# Patient Record
Sex: Female | Born: 1965 | Race: White | Marital: Married | Smoking: Never smoker
Health system: Southern US, Community
[De-identification: ages and names within clinical notes are randomized; demographics above are authoritative.]

## PROBLEM LIST (undated history)

## (undated) DIAGNOSIS — L900 Lichen sclerosus et atrophicus: Secondary | ICD-10-CM

## (undated) DIAGNOSIS — T7840XA Allergy, unspecified, initial encounter: Secondary | ICD-10-CM

## (undated) DIAGNOSIS — M51369 Other intervertebral disc degeneration, lumbar region without mention of lumbar back pain or lower extremity pain: Secondary | ICD-10-CM

## (undated) DIAGNOSIS — M5136 Other intervertebral disc degeneration, lumbar region: Secondary | ICD-10-CM

## (undated) DIAGNOSIS — N952 Postmenopausal atrophic vaginitis: Secondary | ICD-10-CM

## (undated) HISTORY — DX: Other intervertebral disc degeneration, lumbar region: M51.36

## (undated) HISTORY — DX: Lichen sclerosus et atrophicus: L90.0

## (undated) HISTORY — DX: Allergy, unspecified, initial encounter: T78.40XA

## (undated) HISTORY — DX: Postmenopausal atrophic vaginitis: N95.2

## (undated) HISTORY — DX: Other intervertebral disc degeneration, lumbar region without mention of lumbar back pain or lower extremity pain: M51.369

---

## 2003-01-06 HISTORY — PX: CHOLECYSTECTOMY: SHX55

## 2005-01-05 HISTORY — PX: DILATION AND CURETTAGE OF UTERUS: SHX78

## 2019-02-22 LAB — RESULTS CONSOLE HPV: CHL HPV: NEGATIVE

## 2019-02-22 LAB — HM PAP SMEAR

## 2020-03-05 DIAGNOSIS — Z1231 Encounter for screening mammogram for malignant neoplasm of breast: Secondary | ICD-10-CM | POA: Diagnosis not present

## 2020-03-05 DIAGNOSIS — Z13228 Encounter for screening for other metabolic disorders: Secondary | ICD-10-CM | POA: Diagnosis not present

## 2020-03-05 DIAGNOSIS — Z131 Encounter for screening for diabetes mellitus: Secondary | ICD-10-CM | POA: Diagnosis not present

## 2020-03-05 DIAGNOSIS — Z1321 Encounter for screening for nutritional disorder: Secondary | ICD-10-CM | POA: Diagnosis not present

## 2020-03-05 DIAGNOSIS — Z13220 Encounter for screening for lipoid disorders: Secondary | ICD-10-CM | POA: Diagnosis not present

## 2020-03-05 DIAGNOSIS — N939 Abnormal uterine and vaginal bleeding, unspecified: Secondary | ICD-10-CM | POA: Diagnosis not present

## 2020-03-05 DIAGNOSIS — Z1329 Encounter for screening for other suspected endocrine disorder: Secondary | ICD-10-CM | POA: Diagnosis not present

## 2020-03-05 DIAGNOSIS — Z130 Encounter for screening for diseases of the blood and blood-forming organs and certain disorders involving the immune mechanism: Secondary | ICD-10-CM | POA: Diagnosis not present

## 2020-03-05 DIAGNOSIS — Z01419 Encounter for gynecological examination (general) (routine) without abnormal findings: Secondary | ICD-10-CM | POA: Diagnosis not present

## 2020-03-18 DIAGNOSIS — Z1231 Encounter for screening mammogram for malignant neoplasm of breast: Secondary | ICD-10-CM | POA: Diagnosis not present

## 2020-03-20 DIAGNOSIS — N950 Postmenopausal bleeding: Secondary | ICD-10-CM | POA: Diagnosis not present

## 2020-03-20 DIAGNOSIS — Z01818 Encounter for other preprocedural examination: Secondary | ICD-10-CM | POA: Diagnosis not present

## 2020-03-20 DIAGNOSIS — N939 Abnormal uterine and vaginal bleeding, unspecified: Secondary | ICD-10-CM | POA: Diagnosis not present

## 2020-04-04 DIAGNOSIS — D219 Benign neoplasm of connective and other soft tissue, unspecified: Secondary | ICD-10-CM | POA: Diagnosis not present

## 2020-05-08 DIAGNOSIS — Z8739 Personal history of other diseases of the musculoskeletal system and connective tissue: Secondary | ICD-10-CM | POA: Insufficient documentation

## 2020-05-08 DIAGNOSIS — D259 Leiomyoma of uterus, unspecified: Secondary | ICD-10-CM | POA: Diagnosis not present

## 2020-05-08 DIAGNOSIS — Z0000 Encounter for general adult medical examination without abnormal findings: Secondary | ICD-10-CM | POA: Diagnosis not present

## 2020-05-15 ENCOUNTER — Other Ambulatory Visit: Payer: Self-pay | Admitting: Obstetrics and Gynecology

## 2020-05-15 DIAGNOSIS — D259 Leiomyoma of uterus, unspecified: Secondary | ICD-10-CM

## 2020-05-31 ENCOUNTER — Other Ambulatory Visit: Payer: Self-pay

## 2020-05-31 ENCOUNTER — Ambulatory Visit
Admission: RE | Admit: 2020-05-31 | Discharge: 2020-05-31 | Disposition: A | Discharge status: Home / Self Care | Payer: 59 | Source: Ambulatory Visit | Attending: Obstetrics and Gynecology | Admitting: Obstetrics and Gynecology

## 2020-05-31 DIAGNOSIS — D251 Intramural leiomyoma of uterus: Secondary | ICD-10-CM | POA: Diagnosis not present

## 2020-05-31 DIAGNOSIS — D259 Leiomyoma of uterus, unspecified: Secondary | ICD-10-CM

## 2020-05-31 MED ORDER — GADOBENATE DIMEGLUMINE 529 MG/ML IV SOLN
15.0000 mL | Freq: Once | INTRAVENOUS | Status: AC | PRN
  Administered 2020-05-31: 15 mL via INTRAVENOUS

## 2021-04-04 DIAGNOSIS — Z1231 Encounter for screening mammogram for malignant neoplasm of breast: Secondary | ICD-10-CM | POA: Diagnosis not present

## 2021-04-04 LAB — HM MAMMOGRAPHY

## 2021-04-17 DIAGNOSIS — Z01419 Encounter for gynecological examination (general) (routine) without abnormal findings: Secondary | ICD-10-CM | POA: Diagnosis not present

## 2021-04-17 DIAGNOSIS — Z6828 Body mass index (BMI) 28.0-28.9, adult: Secondary | ICD-10-CM | POA: Diagnosis not present

## 2021-06-03 DIAGNOSIS — D259 Leiomyoma of uterus, unspecified: Secondary | ICD-10-CM | POA: Diagnosis not present

## 2021-06-03 DIAGNOSIS — M79671 Pain in right foot: Secondary | ICD-10-CM | POA: Diagnosis not present

## 2021-06-03 DIAGNOSIS — Z0000 Encounter for general adult medical examination without abnormal findings: Secondary | ICD-10-CM | POA: Diagnosis not present

## 2021-06-03 DIAGNOSIS — R39198 Other difficulties with micturition: Secondary | ICD-10-CM | POA: Diagnosis not present

## 2021-06-06 DIAGNOSIS — D259 Leiomyoma of uterus, unspecified: Secondary | ICD-10-CM | POA: Insufficient documentation

## 2021-11-12 DIAGNOSIS — N952 Postmenopausal atrophic vaginitis: Secondary | ICD-10-CM | POA: Diagnosis not present

## 2021-11-12 DIAGNOSIS — L900 Lichen sclerosus et atrophicus: Secondary | ICD-10-CM | POA: Diagnosis not present

## 2022-02-12 DIAGNOSIS — R6882 Decreased libido: Secondary | ICD-10-CM | POA: Diagnosis not present

## 2022-02-12 DIAGNOSIS — N952 Postmenopausal atrophic vaginitis: Secondary | ICD-10-CM | POA: Diagnosis not present

## 2022-02-12 DIAGNOSIS — L900 Lichen sclerosus et atrophicus: Secondary | ICD-10-CM | POA: Diagnosis not present

## 2022-02-17 ENCOUNTER — Ambulatory Visit (INDEPENDENT_AMBULATORY_CARE_PROVIDER_SITE_OTHER): Payer: 59

## 2022-02-17 ENCOUNTER — Ambulatory Visit (INDEPENDENT_AMBULATORY_CARE_PROVIDER_SITE_OTHER): Payer: 59 | Admitting: Family Medicine

## 2022-02-17 ENCOUNTER — Encounter: Payer: Self-pay | Admitting: Family Medicine

## 2022-02-17 VITALS — BP 116/68 | HR 68 | Temp 98.1°F | Ht 65.0 in | Wt 171.9 lb

## 2022-02-17 DIAGNOSIS — G8929 Other chronic pain: Secondary | ICD-10-CM

## 2022-02-17 DIAGNOSIS — Z7689 Persons encountering health services in other specified circumstances: Secondary | ICD-10-CM | POA: Diagnosis not present

## 2022-02-17 DIAGNOSIS — M545 Low back pain, unspecified: Secondary | ICD-10-CM

## 2022-02-17 DIAGNOSIS — M533 Sacrococcygeal disorders, not elsewhere classified: Secondary | ICD-10-CM

## 2022-02-17 NOTE — Progress Notes (Signed)
New Patient Office Visit  Subjective    Patient ID: CHAVON DUSH, female    DOB: May 21, 1965  Age: 57 y.o. MRN: [id number]  CC:  Chief Complaint  Patient presents with   Establish Care   Generalized Body Aches    She is currently doing exercise to help relieve pain.    Sciatica    Throbbing in legs at night when trying to sleep. She has not taken anything for pain.     HPI AARICA GUSTAFSON presents to establish care. Pt is new to me. Works at [employer].  Pt has hx of Lichen Sclerosis, using Clobetasol. Diagnosed in November 2023. Seeing OB/GYN. UTD with pap smears.  She gets mammograms every year. She has family hx of mother, maternal aunts with breast cancer. Her mother and one aunt has passed from breast cancer. She has 1 aunt still living with hx of breast cancer. Mother diagnosed at 74 y.o.  Pt reports she is Postmenopausal. She is having sciatica pains in bilateral legs. She has been sedentary more lately. She reports weight is consistent. Pain starts in bilateral SI joints with some pain shooting down her lateral knees. Other times it's also goes down to right foot. She has not taken anything for the pain. She has issues with swallowing. Non-smoker. She has been doing PT at home and it helps some. She is doing bridges, modified plank, pelvic tilts. She has brief episodes of sensations of her leg giving out. This occurred after walking from the parking garage.   Outpatient Encounter Medications as of 02/17/2022  Medication Sig   clobetasol ointment (TEMOVATE) [id number] % Apply 1 Application topically 2 (two) times daily. vaginal   No facility-administered encounter medications on file as of 02/17/2022.    Past Medical History:  Diagnosis Date   Lichen sclerosus et atrophicus    Postmenopausal atrophic vaginitis    Uterine leiomyoma 06/06/2021    Past Surgical History:  Procedure Laterality Date   CHOLECYSTECTOMY  2005   DILATION AND CURETTAGE OF UTERUS  2007     Family History  Problem Relation Age of Onset   Cancer Mother    Cancer Father    Hypertension Father    Heart disease Father     Social History   Socioeconomic History   Marital status: Married    Spouse name: Not on file   Number of children: Not on file   Years of education: Not on file   Highest education level: Not on file  Occupational History   Occupation: [employer]  Tobacco Use   Smoking status: Never   Smokeless tobacco: Never  Substance and Sexual Activity   Alcohol use: Yes    Alcohol/week: 5.0 standard drinks of alcohol    Types: 4 Glasses of wine, 1 Cans of beer per week   Drug use: Never   Sexual activity: Yes  Other Topics Concern   Not on file  Social History Narrative   Not on file   Social Determinants of Health   Financial Resource Strain: Not on file  Food Insecurity: Not on file  Transportation Needs: Not on file  Physical Activity: Not on file  Stress: Not on file  Social Connections: Not on file  Intimate Partner Violence: Not on file    Review of Systems  Musculoskeletal:  Positive for back pain.       With sciatic leg pain  All other systems reviewed and are negative.  Objective    BP 116/68   Pulse 68   Temp 98.1 F (36.7 C) (Oral)   Ht 5' 5"$  (1.651 m)   Wt 171 lb 14.4 oz (78 kg)   LMP  (LMP Unknown)   SpO2 99%   BMI 28.61 kg/m   Physical Exam Vitals and nursing note reviewed.  Constitutional:      Appearance: Normal appearance. She is normal weight.  HENT:     Head: Normocephalic and atraumatic.     Right Ear: External ear normal.     Left Ear: External ear normal.     Nose: Nose normal.     Mouth/Throat:     Mouth: Mucous membranes are moist.     Pharynx: Oropharynx is clear.  Eyes:     Conjunctiva/sclera: Conjunctivae normal.     Pupils: Pupils are equal, round, and reactive to light.  Cardiovascular:     Rate and Rhythm: Normal rate and regular rhythm.     Pulses: Normal pulses.      Heart sounds: Normal heart sounds.  Pulmonary:     Effort: Pulmonary effort is normal.     Breath sounds: Normal breath sounds.  Abdominal:     General: Abdomen is flat.  Musculoskeletal:        General: Normal range of motion.  Skin:    General: Skin is warm.     Capillary Refill: Capillary refill takes less than 2 seconds.  Neurological:     General: No focal deficit present.     Mental Status: She is alert and oriented to person, place, and time. Mental status is at baseline.  Psychiatric:        Mood and Affect: Mood normal.        Behavior: Behavior normal.        Thought Content: Thought content normal.        Judgment: Judgment normal.        Assessment & Plan:   Problem List Items Addressed This Visit   None Visit Diagnoses     Encounter to establish care with new doctor    -  Primary   Chronic SI joint pain       Relevant Orders   DG Lumbar Spine Complete      Pt with recurrent SI joint pain with sciatica. Never had imaging done Send for imaging and sent home handout with PT exercises. Advised to do OTC ibuprofen, heating pads, aleve/voltaren gel, stretches.  Follow up pending results Annual exam May  Return in about 4 months (around 06/05/2022) for Annual Physical.   Leeanne Rio, MD

## 2022-03-13 ENCOUNTER — Other Ambulatory Visit (HOSPITAL_COMMUNITY): Payer: Self-pay

## 2022-03-13 ENCOUNTER — Telehealth: Payer: 59 | Admitting: Nurse Practitioner

## 2022-03-13 DIAGNOSIS — J069 Acute upper respiratory infection, unspecified: Secondary | ICD-10-CM

## 2022-03-13 MED ORDER — IPRATROPIUM BROMIDE 0.03 % NA SOLN
2.0000 | Freq: Two times a day (BID) | NASAL | 12 refills | Status: DC
  Filled 2022-03-13: qty 30, 43d supply, fill #0

## 2022-03-13 MED ORDER — BENZONATATE 100 MG PO CAPS
100.0000 mg | ORAL_CAPSULE | Freq: Three times a day (TID) | ORAL | 0 refills | Status: DC | PRN
  Filled 2022-03-13: qty 30, 10d supply, fill #0

## 2022-03-13 NOTE — Progress Notes (Signed)
E-Visit for Upper Respiratory Infection   We are sorry you are not feeling well.  Here is how we plan to help!  Based on what you have shared with me, it looks like you may have a viral upper respiratory infection.  Upper respiratory infections are caused by a large number of viruses; however, rhinovirus is the most common cause.   Symptoms vary from person to person, with common symptoms including sore throat, cough, fatigue or lack of energy and feeling of general discomfort.  A low-grade fever of up to 100.4 may present, but is often uncommon.  Symptoms vary however, and are closely related to a person's age or underlying illnesses.  The most common symptoms associated with an upper respiratory infection are nasal discharge or congestion, cough, sneezing, headache and pressure in the ears and face.  These symptoms usually persist for about 3 to 10 days, but can last up to 2 weeks.  It is important to know that upper respiratory infections do not cause serious illness or complications in most cases.    Providers prescribe antibiotics to treat infections caused by bacteria. Antibiotics are very powerful in treating bacterial infections when they are used properly. To maintain their effectiveness, they should be used only when necessary. Overuse of antibiotics has resulted in the development of superbugs that are resistant to treatment!    After careful review of your answers, I would not recommend an antibiotic for your condition.  Antibiotics are not effective against viruses and therefore should not be used to treat them. Common examples of infections caused by viruses include colds and flu   Upper respiratory infections can be transmitted from person to person, with the most common method of transmission being a person's hands.  The virus is able to live on the skin and can infect other persons for up to 2 hours after direct contact.  Also, these can be transmitted when someone coughs or sneezes;  thus, it is important to cover the mouth to reduce this risk.  To keep the spread of the illness at [place], good hand hygiene is very important.  This is an infection that is most likely caused by a virus. There are no specific treatments other than to help you with the symptoms until the infection runs its course.  We are sorry you are not feeling well.  Here is how we plan to help!   For nasal congestion, you may use an oral decongestants such as Mucinex D or if you have glaucoma or high blood pressure use plain Mucinex.  Saline nasal spray or nasal drops can help and can safely be used as often as needed for congestion.  For your congestion, I have prescribed Ipratropium Bromide nasal spray 0.03% two sprays in each nostril 2-3 times a day  If you do not have a history of heart disease, hypertension, diabetes or thyroid disease, prostate/bladder issues or glaucoma, you may also use Sudafed to treat nasal congestion.  It is highly recommended that you consult with a pharmacist or your primary care physician to ensure this medication is safe for you to take.     If you have a cough, you may use cough suppressants such as Delsym and Robitussin.  If you have glaucoma or high blood pressure, you can also use Coricidin HBP.   For cough I have prescribed for you A prescription cough medication called Tessalon Perles 100 mg. You may take 1-2 capsules every 8 hours as needed for cough  If  you have a sore or scratchy throat, use a saltwater gargle-  to  teaspoon of salt dissolved in a 4-ounce to 8-ounce glass of warm water.  Gargle the solution for approximately 15-30 seconds and then spit.  It is important not to swallow the solution.  You can also use throat lozenges/cough drops and Chloraseptic spray to help with throat pain or discomfort.  Warm or cold liquids can also be helpful in relieving throat pain.  For headache, pain or general discomfort, you can use Ibuprofen or Tylenol as directed.   Some  authorities believe that zinc sprays or the use of Echinacea may shorten the course of your symptoms.   HOME CARE Only take medications as instructed by your medical team. Be sure to drink plenty of fluids. Water is fine as well as fruit juices, sodas and electrolyte beverages. You may want to stay away from caffeine or alcohol. If you are nauseated, try taking small sips of liquids. How do you know if you are getting enough fluid? Your urine should be a pale yellow or almost colorless. Get rest. Taking a steamy shower or using a humidifier may help nasal congestion and ease sore throat pain. You can place a towel over your head and breathe in the steam from hot water coming from a faucet. Using a saline nasal spray works much the same way. Cough drops, hard candies and sore throat lozenges may ease your cough. Avoid close contacts especially the very young and the elderly Cover your mouth if you cough or sneeze Always remember to wash your hands.   GET HELP RIGHT AWAY IF: You develop worsening fever. If your symptoms do not improve within 10 days You develop yellow or green discharge from your nose over 3 days. You have coughing fits You develop a severe head ache or visual changes. You develop shortness of breath, difficulty breathing or start having chest pain Your symptoms persist after you have completed your treatment plan  MAKE SURE YOU  Understand these instructions. Will watch your condition. Will get help right away if you are not doing well or get worse.  Thank you for choosing an e-visit.  Your e-visit answers were reviewed by a board certified advanced clinical practitioner to complete your personal care plan. Depending upon the condition, your plan could have included both over the counter or prescription medications.  Please review your pharmacy choice. Make sure the pharmacy is open so you can pick up prescription now. If there is a problem, you may contact your  provider through [employer] and have the prescription routed to another pharmacy.  Your safety is important to [place]. If you have drug allergies check your prescription carefully.   For the next 24 hours you can use MyChart to ask questions about today's visit, request a non-urgent call back, or ask for a work or school excuse. You will get an email in the next two days asking about your experience. I hope that your e-visit has been valuable and will speed your recovery.  Meds ordered this encounter  Medications   ipratropium (ATROVENT) 0.03 % nasal spray    Sig: Place 2 sprays into both nostrils every 12 (twelve) hours.    Dispense:  30 mL    Refill:  12   benzonatate (TESSALON) 100 MG capsule    Sig: Take 1 capsule (100 mg total) by mouth 3 (three) times daily as needed.    Dispense:  30 capsule    Refill:  0  I spent approximately 5 minutes reviewing the patient's history, current symptoms and coordinating their care today.

## 2022-03-17 ENCOUNTER — Telehealth: Payer: 59 | Admitting: Nurse Practitioner

## 2022-03-17 ENCOUNTER — Other Ambulatory Visit (HOSPITAL_COMMUNITY): Payer: Self-pay

## 2022-03-17 DIAGNOSIS — J0140 Acute pansinusitis, unspecified: Secondary | ICD-10-CM

## 2022-03-17 MED ORDER — DOXYCYCLINE HYCLATE 100 MG PO TABS
100.0000 mg | ORAL_TABLET | Freq: Two times a day (BID) | ORAL | 0 refills | Status: AC
  Filled 2022-03-17: qty 20, 10d supply, fill #0

## 2022-03-17 NOTE — Progress Notes (Signed)
E-Visit for Sinus Problems  We are sorry that you are not feeling well.  Here is how we plan to help!  Based on what you have shared with me it looks like you have sinusitis.  Sinusitis is inflammation and infection in the sinus cavities of the head.  Based on your presentation I believe you most likely have Acute Bacterial Sinusitis.  This is an infection caused by bacteria and is treated with antibiotics. I have prescribed Doxycycline 100mg by mouth twice a day for 10 days. You may use an oral decongestant such as Mucinex D or if you have glaucoma or high blood pressure use plain Mucinex. Saline nasal spray help and can safely be used as often as needed for congestion.  If you develop worsening sinus pain, fever or notice severe headache and vision changes, or if symptoms are not better after completion of antibiotic, please schedule an appointment with a health care provider.    Sinus infections are not as easily transmitted as other respiratory infection, however we still recommend that you avoid close contact with loved ones, especially the very young and elderly.  Remember to wash your hands thoroughly throughout the day as this is the number one way to prevent the spread of infection!  Home Care: Only take medications as instructed by your medical team. Complete the entire course of an antibiotic. Do not take these medications with alcohol. A steam or ultrasonic humidifier can help congestion.  You can place a towel over your head and breathe in the steam from hot water coming from a faucet. Avoid close contacts especially the very young and the elderly. Cover your mouth when you cough or sneeze. Always remember to wash your hands.  Get Help Right Away If: You develop worsening fever or sinus pain. You develop a severe head ache or visual changes. Your symptoms persist after you have completed your treatment plan.  Make sure you Understand these instructions. Will watch your  condition. Will get help right away if you are not doing well or get worse.  Thank you for choosing an e-visit.  Your e-visit answers were reviewed by a board certified advanced clinical practitioner to complete your personal care plan. Depending upon the condition, your plan could have included both over the counter or prescription medications.  Please review your pharmacy choice. Make sure the pharmacy is open so you can pick up prescription now. If there is a problem, you may contact your provider through MyChart messaging and have the prescription routed to another pharmacy.  Your safety is important to us. If you have drug allergies check your prescription carefully.   For the next 24 hours you can use MyChart to ask questions about today's visit, request a non-urgent call back, or ask for a work or school excuse. You will get an email in the next two days asking about your experience. I hope that your e-visit has been valuable and will speed your recovery.   Meds ordered this encounter  Medications   doxycycline (VIBRA-TABS) 100 MG tablet    Sig: Take 1 tablet (100 mg total) by mouth 2 (two) times daily for 10 days.    Dispense:  20 tablet    Refill:  0    I spent approximately 5 minutes reviewing the patient's history, current symptoms and coordinating their care today.   

## 2022-04-06 DIAGNOSIS — R92323 Mammographic fibroglandular density, bilateral breasts: Secondary | ICD-10-CM | POA: Diagnosis not present

## 2022-04-06 DIAGNOSIS — Z1231 Encounter for screening mammogram for malignant neoplasm of breast: Secondary | ICD-10-CM | POA: Diagnosis not present

## 2022-04-06 LAB — HM MAMMOGRAPHY

## 2022-04-08 ENCOUNTER — Encounter: Payer: Self-pay | Admitting: Family Medicine

## 2022-04-22 DIAGNOSIS — Z01419 Encounter for gynecological examination (general) (routine) without abnormal findings: Secondary | ICD-10-CM | POA: Diagnosis not present

## 2022-04-22 DIAGNOSIS — Z8481 Family history of carrier of genetic disease: Secondary | ICD-10-CM | POA: Diagnosis not present

## 2022-06-18 ENCOUNTER — Encounter: Payer: Self-pay | Admitting: Family Medicine

## 2022-06-18 ENCOUNTER — Ambulatory Visit (INDEPENDENT_AMBULATORY_CARE_PROVIDER_SITE_OTHER): Payer: 59 | Admitting: Family Medicine

## 2022-06-18 VITALS — BP 98/62 | HR 72 | Temp 98.1°F | Resp 20 | Ht 65.0 in | Wt 180.6 lb

## 2022-06-18 DIAGNOSIS — Z0000 Encounter for general adult medical examination without abnormal findings: Secondary | ICD-10-CM

## 2022-06-18 DIAGNOSIS — Z1159 Encounter for screening for other viral diseases: Secondary | ICD-10-CM | POA: Diagnosis not present

## 2022-06-18 DIAGNOSIS — G8929 Other chronic pain: Secondary | ICD-10-CM

## 2022-06-18 DIAGNOSIS — Z136 Encounter for screening for cardiovascular disorders: Secondary | ICD-10-CM | POA: Diagnosis not present

## 2022-06-18 DIAGNOSIS — R7302 Impaired glucose tolerance (oral): Secondary | ICD-10-CM | POA: Diagnosis not present

## 2022-06-18 DIAGNOSIS — M533 Sacrococcygeal disorders, not elsewhere classified: Secondary | ICD-10-CM

## 2022-06-18 DIAGNOSIS — Z13220 Encounter for screening for lipoid disorders: Secondary | ICD-10-CM | POA: Diagnosis not present

## 2022-06-18 NOTE — Progress Notes (Signed)
Complete physical exam  Patient: Alexis Shepherd   DOB: 25-Mar-1965   57 y.o. Female  MRN: [id number]  Subjective:    Chief Complaint  Patient presents with   Annual Exam    Patient is here for her annual physical, she still lower back pain and states that she is deciding on getting injections. Patient states that she does use anti-inflammatories for now    LENNAN WESELY is a 57 y.o. female who presents today for a complete physical exam. She reports consuming a general diet. The patient does not participate in regular exercise at present. She generally feels well. She reports sleeping well. She does not have additional problems to discuss today.  Pt still having back pain. Not being active as much like she normally does.  Pt uses Tumeric and anti-inflammatories for her back.   Most recent fall risk assessment:    02/17/2022    8:25 AM  Fall Risk   Falls in the past year? 0  Number falls in past yr: 0  Injury with Fall? 0  Risk for fall due to : No Fall Risks     Most recent depression screenings:    02/17/2022    8:24 AM  PHQ 2/9 Scores  PHQ - 2 Score 0  PHQ- 9 Score 2    Vision:Within last year  Patient Active Problem List   Diagnosis Date Noted   Uterine leiomyoma 06/06/2021   History of left tennis elbow 05/08/2020   Past Medical History:  Diagnosis Date   DDD (degenerative disc disease), lumbar    Lichen sclerosus et atrophicus    Postmenopausal atrophic vaginitis    Uterine leiomyoma 06/06/2021   Past Surgical History:  Procedure Laterality Date   CHOLECYSTECTOMY  2005   DILATION AND CURETTAGE OF UTERUS  2007   Social History   Tobacco Use   Smoking status: Never   Smokeless tobacco: Never  Substance Use Topics   Alcohol use: Yes    Alcohol/week: 5.0 standard drinks of alcohol    Types: 4 Glasses of wine, 1 Cans of beer per week   Drug use: Never   Family History  Problem Relation Age of Onset   Cancer Mother    Cancer Father    Hypertension  Father    Heart disease Father    Allergies  Allergen Reactions   Sulfa Antibiotics Tinitus   Penicillins Hives      Patient Care Team: Suzan Slick, MD as PCP - General (Family Medicine)   Outpatient Medications Prior to Visit  Medication Sig   [DISCONTINUED] benzonatate (TESSALON) 100 MG capsule Take 1 capsule (100 mg total) by mouth 3 (three) times daily as needed.   [DISCONTINUED] clobetasol ointment (TEMOVATE) 0.05 % Apply 1 Application topically 2 (two) times daily. vaginal   [DISCONTINUED] ipratropium (ATROVENT) 0.03 % nasal spray Place 2 sprays into both nostrils every 12 (twelve) hours.   No facility-administered medications prior to visit.    Review of Systems  Musculoskeletal:  Positive for back pain.  All other systems reviewed and are negative.        Objective:     BP 98/62   Pulse 72   Temp 98.1 F (36.7 C) (Oral)   Resp 20   Ht 5\' 5"  (1.651 m)   Wt 180 lb 9.6 oz (81.9 kg)   LMP  (LMP Unknown)   SpO2 100%   BMI 30.05 kg/m  BP Readings from Last 3 Encounters:  06/18/22 98/62  02/17/22 116/68      Physical Exam Vitals and nursing note reviewed.  Constitutional:      Appearance: Normal appearance. She is normal weight.  HENT:     Head: Normocephalic and atraumatic.     Right Ear: Tympanic membrane, ear canal and external ear normal.     Left Ear: Tympanic membrane, ear canal and external ear normal.     Nose: Nose normal.     Mouth/Throat:     Mouth: Mucous membranes are moist.  Eyes:     Conjunctiva/sclera: Conjunctivae normal.     Pupils: Pupils are equal, round, and reactive to light.  Cardiovascular:     Rate and Rhythm: Normal rate and regular rhythm.  Pulmonary:     Effort: Pulmonary effort is normal.     Breath sounds: Normal breath sounds.  Abdominal:     General: Abdomen is flat. Bowel sounds are normal.  Musculoskeletal:        General: Normal range of motion.  Skin:    General: Skin is warm.     Capillary Refill:  Capillary refill takes less than 2 seconds.  Neurological:     General: No focal deficit present.     Mental Status: She is alert and oriented to person, place, and time. Mental status is at baseline.  Psychiatric:        Mood and Affect: Mood normal.        Behavior: Behavior normal.        Thought Content: Thought content normal.        Judgment: Judgment normal.     No results found for any visits on 06/18/22.     Assessment & Plan:    Routine Health Maintenance and Physical Exam  Immunization History  Administered Date(s) Administered   Influenza,inj,Quad PF,6+ Mos 10/06/2021   PFIZER(Purple Top)SARS-COV-2 Vaccination 06/16/2019, 07/07/2019, 01/27/2020   Tdap 05/06/2018    Health Maintenance  Topic Date Due   HIV Screening  Never done   Hepatitis C Screening  Never done   Zoster Vaccines- Shingrix (1 of 2) Never done   COVID-19 Vaccine (4 - 2023-24 season) 09/05/2021   INFLUENZA VACCINE  08/06/2022   PAP SMEAR-Modifier  02/22/2024   MAMMOGRAM  04/05/2024   DTaP/Tdap/Td (2 - Td or Tdap) 05/05/2028   Colonoscopy  05/14/2029   HPV VACCINES  Aged Out    Discussed health benefits of physical activity, and encouraged her to engage in regular exercise appropriate for her age and condition.  Problem List Items Addressed This Visit   None  No follow-ups on file. Annual physical exam  Encounter for lipid screening for cardiovascular disease -     Lipid panel  Impaired glucose tolerance -     CBC with Differential/Platelet -     Comprehensive metabolic panel -     Hemoglobin A1c  Chronic SI joint pain  Screening for viral disease -     Hepatitis C antibody -     HIV Antibody (routine testing w rflx)   Screening labs If pain still persists, to let me know and will refer to PMR for back pain.     Suzan Slick, MD

## 2022-06-19 LAB — COMPREHENSIVE METABOLIC PANEL
ALT: 15 IU/L (ref 0–32)
AST: 18 IU/L (ref 0–40)
Albumin/Globulin Ratio: 1.6
Albumin: 4.4 g/dL (ref 3.8–4.9)
Alkaline Phosphatase: 125 IU/L — ABNORMAL HIGH (ref 44–121)
BUN/Creatinine Ratio: 14 (ref 9–23)
BUN: 13 mg/dL (ref 6–24)
Bilirubin Total: 0.4 mg/dL (ref 0.0–1.2)
CO2: 24 mmol/L (ref 20–29)
Calcium: 9.8 mg/dL (ref 8.7–10.2)
Chloride: 103 mmol/L (ref 96–106)
Creatinine, Ser: 0.95 mg/dL (ref 0.57–1.00)
Globulin, Total: 2.7 g/dL (ref 1.5–4.5)
Glucose: 86 mg/dL (ref 70–99)
Potassium: 4.5 mmol/L (ref 3.5–5.2)
Sodium: 142 mmol/L (ref 134–144)
Total Protein: 7.1 g/dL (ref 6.0–8.5)
eGFR: 71 mL/min/{1.73_m2} (ref >59)

## 2022-06-19 LAB — CBC WITH DIFFERENTIAL/PLATELET
Basophils Absolute: 0.1 10*3/uL (ref 0.0–0.2)
Basos: 1 %
EOS (ABSOLUTE): 0.1 10*3/uL (ref 0.0–0.4)
Eos: 2 %
Hematocrit: 39.6 % (ref 34.0–46.6)
Hemoglobin: 13.1 g/dL (ref 11.1–15.9)
Immature Grans (Abs): 0 10*3/uL (ref 0.0–0.1)
Immature Granulocytes: 0 %
Lymphocytes Absolute: 1.8 10*3/uL (ref 0.7–3.1)
Lymphs: 38 %
MCH: 30.5 pg (ref 26.6–33.0)
MCHC: 33.1 g/dL (ref 31.5–35.7)
MCV: 92 fL (ref 79–97)
Monocytes Absolute: 0.4 10*3/uL (ref 0.1–0.9)
Monocytes: 8 %
Neutrophils Absolute: 2.4 10*3/uL (ref 1.4–7.0)
Neutrophils: 51 %
Platelets: 360 10*3/uL (ref 150–450)
RBC: 4.3 x10E6/uL (ref 3.77–5.28)
RDW: 12.5 % (ref 11.7–15.4)
WBC: 4.7 10*3/uL (ref 3.4–10.8)

## 2022-06-19 LAB — LIPID PANEL
Chol/HDL Ratio: 3.2 ratio (ref 0.0–4.4)
Cholesterol, Total: 237 mg/dL — ABNORMAL HIGH (ref 100–199)
HDL: 74 mg/dL (ref >39)
LDL Chol Calc (NIH): 151 mg/dL — ABNORMAL HIGH (ref 0–99)
Triglycerides: 72 mg/dL (ref 0–149)
VLDL Cholesterol Cal: 12 mg/dL (ref 5–40)

## 2022-06-19 LAB — HIV ANTIBODY (ROUTINE TESTING W REFLEX): HIV Screen 4th Generation wRfx: NONREACTIVE

## 2022-06-19 LAB — HEMOGLOBIN A1C
Est. average glucose Bld gHb Est-mCnc: 114 mg/dL
Hgb A1c MFr Bld: 5.6 % (ref 4.8–5.6)

## 2022-06-19 LAB — HEPATITIS C ANTIBODY: Hep C Virus Ab: NONREACTIVE

## 2022-07-30 ENCOUNTER — Telehealth: Payer: Self-pay | Admitting: Family Medicine

## 2022-07-30 DIAGNOSIS — K219 Gastro-esophageal reflux disease without esophagitis: Secondary | ICD-10-CM | POA: Insufficient documentation

## 2022-07-30 DIAGNOSIS — G8929 Other chronic pain: Secondary | ICD-10-CM

## 2022-07-30 NOTE — Telephone Encounter (Signed)
Patient calling and requesting to be referred to provider who can do injections as previously discussed. Patient would like to stay within the [facility] loop if possible. Alexis Shepherd

## 2022-07-30 NOTE — Telephone Encounter (Signed)
Referral placed.

## 2022-07-31 NOTE — Telephone Encounter (Signed)
Referral and clinical notes have been faxed to [facility] through [employer]. Office will contact patient to schedule referral appointment.

## 2022-11-12 ENCOUNTER — Ambulatory Visit (INDEPENDENT_AMBULATORY_CARE_PROVIDER_SITE_OTHER): Payer: 59

## 2022-11-12 ENCOUNTER — Other Ambulatory Visit: Payer: Self-pay | Admitting: Podiatry

## 2022-11-12 ENCOUNTER — Encounter: Payer: Self-pay | Admitting: Podiatry

## 2022-11-12 ENCOUNTER — Other Ambulatory Visit (HOSPITAL_COMMUNITY): Payer: Self-pay

## 2022-11-12 ENCOUNTER — Ambulatory Visit (INDEPENDENT_AMBULATORY_CARE_PROVIDER_SITE_OTHER): Payer: 59 | Admitting: Podiatry

## 2022-11-12 DIAGNOSIS — M722 Plantar fascial fibromatosis: Secondary | ICD-10-CM

## 2022-11-12 DIAGNOSIS — M79672 Pain in left foot: Secondary | ICD-10-CM | POA: Diagnosis not present

## 2022-11-12 DIAGNOSIS — M778 Other enthesopathies, not elsewhere classified: Secondary | ICD-10-CM

## 2022-11-12 MED ORDER — METHYLPREDNISOLONE 4 MG PO TBPK
ORAL_TABLET | ORAL | 0 refills | Status: DC
  Filled 2022-11-12: qty 21, 6d supply, fill #0

## 2022-11-12 MED ORDER — TRIAMCINOLONE ACETONIDE 40 MG/ML IJ SUSP
20.0000 mg | Freq: Once | INTRAMUSCULAR | Status: AC
Start: 2022-11-12 — End: 2022-11-12
  Administered 2022-11-12: 20 mg

## 2022-11-12 MED ORDER — MELOXICAM 15 MG PO TABS
15.0000 mg | ORAL_TABLET | Freq: Every day | ORAL | 3 refills | Status: DC
  Filled 2022-11-12: qty 30, 30d supply, fill #0
  Filled 2023-06-08: qty 30, 30d supply, fill #1
  Filled 2023-08-30: qty 30, 30d supply, fill #2

## 2022-11-12 NOTE — Patient Instructions (Signed)

## 2022-11-12 NOTE — Progress Notes (Signed)
  Subjective:  Patient ID: Alexis Shepherd, female    DOB: [birth year],  MRN: [id number] HPI Chief Complaint  Patient presents with   Foot Pain    Plantar/posterior heel left - aching x several months, AM pain, tried rolling ball in arch, wears supportive shoes daily, history of heel spurs  Dorsal midfoot right - intermittent pain x months   New Patient (Initial Visit)    57 y.o. female presents with the above complaint.   ROS: Denies fever chills nausea vomit muscle aches pains calf pain back pain chest pain shortness of breath.  Past Medical History:  Diagnosis Date   DDD (degenerative disc disease), lumbar    Lichen sclerosus et atrophicus    Postmenopausal atrophic vaginitis    Uterine leiomyoma 06/06/2021   Past Surgical History:  Procedure Laterality Date   CHOLECYSTECTOMY  2005   DILATION AND CURETTAGE OF UTERUS  2007    Current Outpatient Medications:    meloxicam (MOBIC) 15 MG tablet, Take 1 tablet (15 mg total) by mouth daily., Disp: 30 tablet, Rfl: 3   methylPREDNISolone (MEDROL DOSEPAK) 4 MG TBPK tablet, 6 day dose pack - take as directed, Disp: 21 tablet, Rfl: 0  Allergies  Allergen Reactions   Sulfa Antibiotics Tinitus   Penicillins Hives   Review of Systems Objective:  There were no vitals filed for this visit.  General: Well developed, nourished, in no acute distress, alert and oriented x3   Dermatological: Skin is warm, dry and supple bilateral. Nails x 10 are well maintained; remaining integument appears unremarkable at this time. There are no open sores, no preulcerative lesions, no rash or signs of infection present.  Vascular: Dorsalis Pedis artery and Posterior Tibial artery pedal pulses are 2/4 bilateral with immedate capillary fill time. Pedal hair growth present. No varicosities and no lower extremity edema present bilateral.   Neruologic: Grossly intact via light touch bilateral. Vibratory intact via tuning fork bilateral. Protective threshold  with Semmes Wienstein monofilament intact to all pedal sites bilateral. Patellar and Achilles deep tendon reflexes 2+ bilateral. No Babinski or clonus noted bilateral.   Musculoskeletal: No gross boney pedal deformities bilateral. No pain, crepitus, or limitation noted with foot and ankle range of motion bilateral. Muscular strength 5/5 in all groups tested bilateral.  Pain on palpation MucoClear tubercle of the left heel.  Gait: Unassisted, Nonantalgic.    Radiographs:  Radiographs taken today demonstrate an osseously mature individual no significant findings other than early osteoarthritic changes of the right foot particularly the second digit.  Also has a plantar distal aortic again heel spur left soft tissue increase in density at the plantar fascial calcaneal insertion site mild thickening of the Achilles tendon.    Assessment & Plan:   Assessment: Planter fasciitis left.  Osteoarthritis second PIPJ right.  Plan: Discussed etiology pathology conservative surgical therapies I injected the left heel today 20 mg Kenalog 5 mg of Marcaine started on methylprednisolone to be followed by meloxicam.  Discussed appropriate shoe gear stretching exercise ice therapy shoe modifications dispensed plantar fascia brace.     Maxime Beckner T. [place], [place]

## 2022-12-15 ENCOUNTER — Ambulatory Visit: Payer: 59 | Admitting: Podiatry

## 2023-01-20 ENCOUNTER — Ambulatory Visit: Payer: 59 | Admitting: Podiatry

## 2023-01-20 ENCOUNTER — Encounter: Payer: Self-pay | Admitting: Podiatry

## 2023-01-20 DIAGNOSIS — M722 Plantar fascial fibromatosis: Secondary | ICD-10-CM

## 2023-01-20 MED ORDER — BETAMETHASONE SOD PHOS & ACET 6 (3-3) MG/ML IJ SUSP
3.0000 mg | Freq: Once | INTRAMUSCULAR | Status: AC
  Administered 2023-01-20: 3 mg via INTRA_ARTICULAR

## 2023-01-20 NOTE — Progress Notes (Signed)
   Chief Complaint  Patient presents with   Plantar Fasciitis    Patient is here because she has heel pain and she has questions about her left foot , she did xrays for another doctor and they gave her a brace but patient feels like it is getting worst     Subjective: 58 y.o. female presenting for follow-up treatment and evaluation of plantar fasciitis to the left heel.  Initially seen by Dr. Lara Plants on 11/12/2022.  At that time she was given a cortisone injection which she says helped for about 4 weeks.  She did not take the Medrol  Dosepak that was prescribed and only took about 3 of the meloxicam .  Pain is slowly returned.  She presents for further treatment evaluation   Past Medical History:  Diagnosis Date   DDD (degenerative disc disease), lumbar    Lichen sclerosus et atrophicus    Postmenopausal atrophic vaginitis    Uterine leiomyoma 06/06/2021   Past Surgical History:  Procedure Laterality Date   CHOLECYSTECTOMY  2005   DILATION AND CURETTAGE OF UTERUS  2007   Allergies  Allergen Reactions   Sulfa Antibiotics Tinitus   Penicillins Hives     Objective: Physical Exam General: The patient is alert and oriented x3 in no acute distress.  Dermatology: Skin is warm, dry and supple bilateral lower extremities. Negative for open lesions or macerations bilateral.   Vascular: Dorsalis Pedis and Posterior Tibial pulses palpable bilateral.  Capillary fill time is immediate to all digits.  Neurological: Grossly intact via light touch  Musculoskeletal: Tenderness to palpation to the plantar aspect of the left heel along the plantar fascia. All other joints range of motion within normal limits bilateral. Strength 5/5 in all groups bilateral.   Radiographic exam B/L feet 11/12/2022: Normal osseous mineralization. Joint spaces preserved. No fracture/dislocation/boney destruction. No other soft tissue abnormalities or radiopaque foreign bodies.  Plantar heel spur noted  bilateral  Assessment: 1. Plantar fasciitis left foot  Plan of Care:  -Patient evaluated. Xrays reviewed that were taken last visit on 11/12/2022.   -Injection of 0.5cc Celestone  soluspan injected into the left plantar fascia.  -Patient did not take the Medrol  Dosepak prescribed last visit with Dr. Lara Plants -Recommend that the patient takes the meloxicam  15 mg daily. 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 Dot Gazella, DPM    [address] [place], [place] [address]                Office [phone]  Fax [phone]

## 2023-01-25 ENCOUNTER — Encounter: Payer: Self-pay | Admitting: Podiatry

## 2023-01-28 ENCOUNTER — Ambulatory Visit: Payer: 59 | Admitting: Podiatry

## 2023-02-24 ENCOUNTER — Ambulatory Visit: Payer: 59 | Admitting: Podiatry

## 2023-03-01 ENCOUNTER — Ambulatory Visit: Payer: 59 | Admitting: Podiatry

## 2023-03-11 ENCOUNTER — Ambulatory Visit: Payer: 59 | Admitting: Family Medicine

## 2023-03-24 ENCOUNTER — Encounter: Payer: Self-pay | Admitting: Podiatry

## 2023-03-24 ENCOUNTER — Ambulatory Visit (INDEPENDENT_AMBULATORY_CARE_PROVIDER_SITE_OTHER): Admitting: Podiatry

## 2023-03-24 VITALS — Ht 65.0 in | Wt 180.6 lb

## 2023-03-24 DIAGNOSIS — M722 Plantar fascial fibromatosis: Secondary | ICD-10-CM

## 2023-03-24 NOTE — Progress Notes (Signed)
   Chief Complaint  Patient presents with   Foot Pain    Pt is here to f/u on left foot pain.    Subjective: 58 y.o. female presenting for follow-up for plantar fasciitis to the left heel.  Patient has noticed over the last 2 months of gradual improvement.  She continues to have some slight tenderness but overall significant improvement   Past Medical History:  Diagnosis Date   DDD (degenerative disc disease), lumbar    Lichen sclerosus et atrophicus    Postmenopausal atrophic vaginitis    Uterine leiomyoma 06/06/2021   Past Surgical History:  Procedure Laterality Date   CHOLECYSTECTOMY  2005   DILATION AND CURETTAGE OF UTERUS  2007   Allergies  Allergen Reactions   Sulfa Antibiotics Tinitus   Penicillins Hives     Objective: Physical Exam General: The patient is alert and oriented x3 in no acute distress.  Dermatology: Skin is warm, dry and supple bilateral lower extremities. Negative for open lesions or macerations bilateral.   Vascular: Dorsalis Pedis and Posterior Tibial pulses palpable bilateral.  Capillary fill time is immediate to all digits.  Neurological: Grossly intact via light touch  Musculoskeletal: There continues to be some slight tenderness to palpation to the plantar aspect of the left heel along the plantar fascia. All other joints range of motion within normal limits bilateral. Strength 5/5 in all groups bilateral.   Radiographic exam B/L feet 11/12/2022: Normal osseous mineralization. Joint spaces preserved. No fracture/dislocation/boney destruction. No other soft tissue abnormalities or radiopaque foreign bodies.  Plantar heel spur noted bilateral  Assessment: 1. Plantar fasciitis left foot  Plan of Care:  -Patient evaluated.  -No cortisone injection administered today. -Continue wearing good supportive tennis shoes and sneakers -Meloxicam 15 mg daily as needed -Refrain from going barefoot.  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 Felecia Shelling, DPM    [address] [place], [place] [address]                Office [phone]  Fax [phone]

## 2023-03-29 ENCOUNTER — Ambulatory Visit: Payer: 59 | Admitting: Podiatry

## 2023-03-30 IMAGING — MR MR PELVIS WO/W CM
7 of 11 series · 31 of 48 positions shown · IV contrast (multihance)
Comparison: None.

CLINICAL DATA: Uterine leiomyoma

EXAM:
MRI PELVIS WITHOUT AND WITH CONTRAST
TECHNIQUE: Multiplanar multisequence MR imaging of the pelvis was performed
both before and after administration of intravenous contrast.
CONTRAST:  15mL MULTIHANCE GADOBENATE DIMEGLUMINE 529 MG/ML IV SOLN

[Series 2: cor haste · coronal · 6.0mm · 0.78mm/px · 4 of 25 slices shown]
[im 1/25]
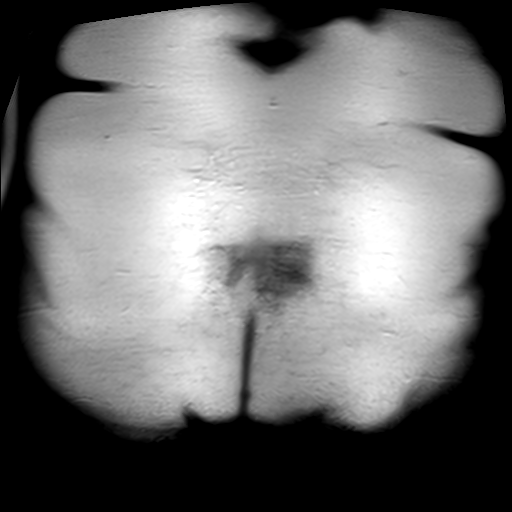
[im 9/25]
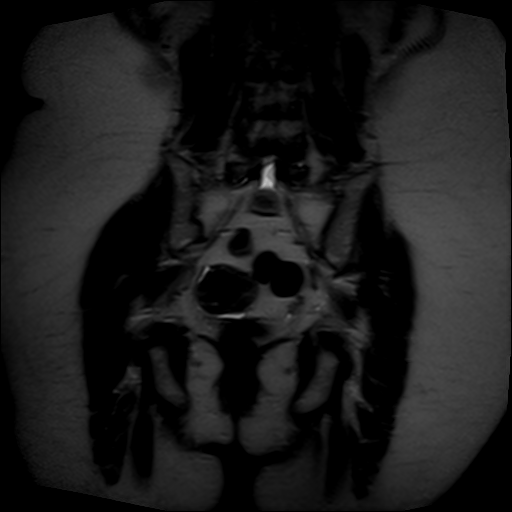
[im 17/25]
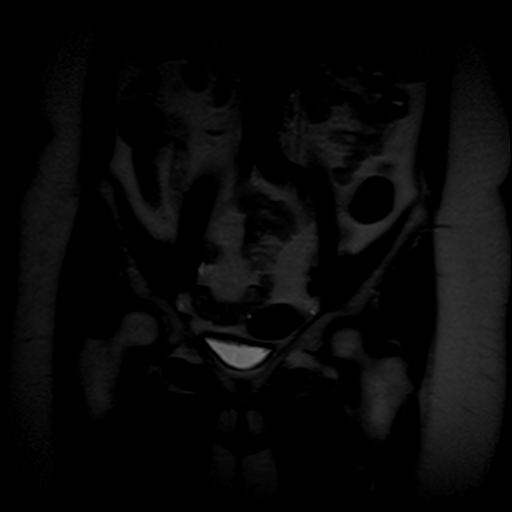
[im 25/25]
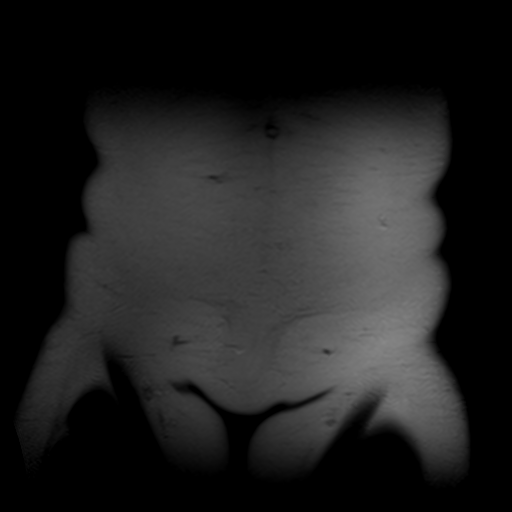

[Series 3: t2_tse_sag · sagittal · 5.0mm · 1.05mm/px · 4 of 27 slices shown]
[im 1/27]
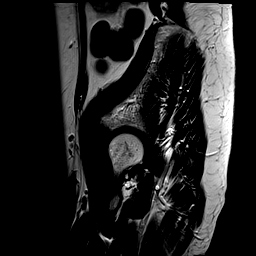
[im 9/27]
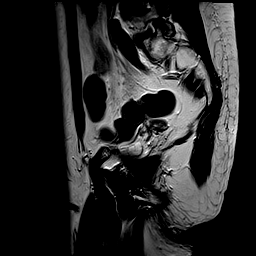
[im 18/27]
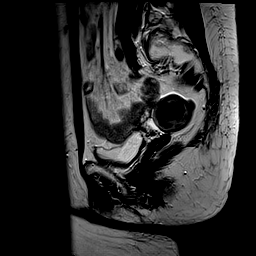
[im 27/27]
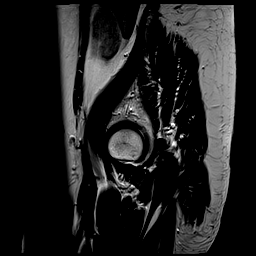

[Series 4: t2_tse axial · axial · 7.0mm · 0.98mm/px · z∈[-148,+79]mm · 4 of 26 slices shown]
[im 1/26]
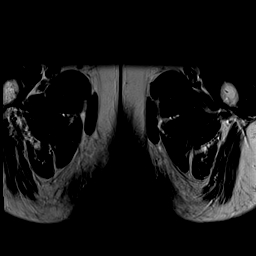
[im 9/26]
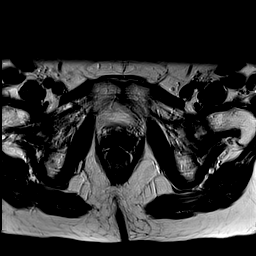
[im 17/26]
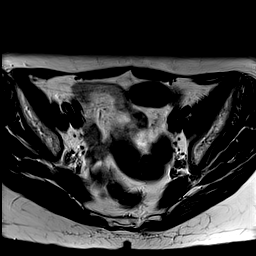
[im 26/26]
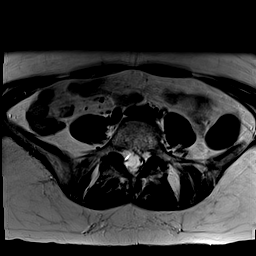

[Series 5: t2_tse axial fs · axial · 7.0mm · 0.98mm/px · z∈[-143,+84]mm · 5 of 26 slices shown]
[im 1/26]
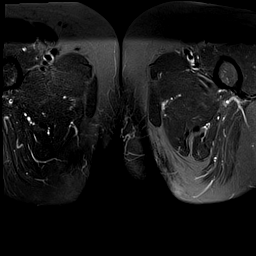
[im 7/26]
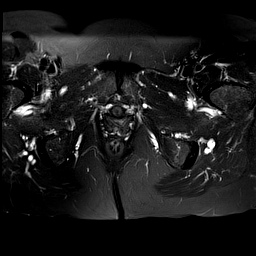
[im 13/26]
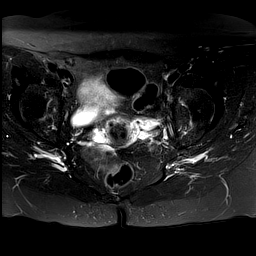
[im 19/26]
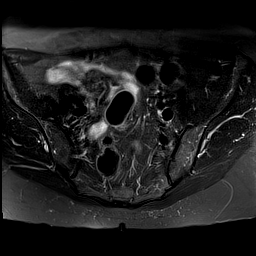
[im 26/26]
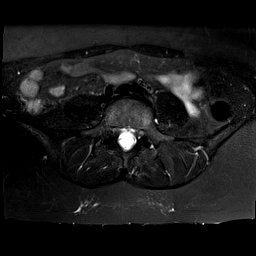

[Series 6: axial spgr · axial · 7.0mm · 0.98mm/px · z∈[-122,+105]mm · 5 of 26 slices shown]
[im 1/26]
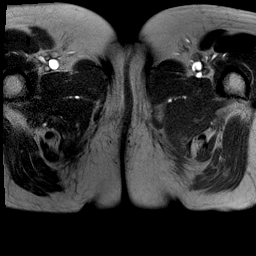
[im 7/26]
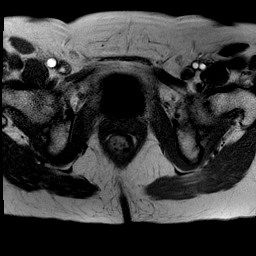
[im 13/26]
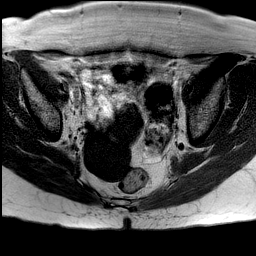
[im 19/26]
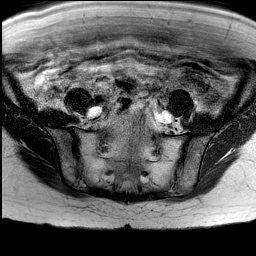
[im 26/26]
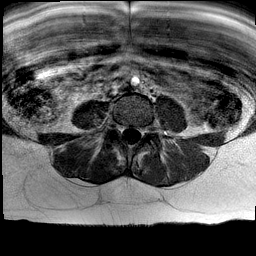

[Series 7: axial spgr pre · axial · non-contrast · 7.0mm · 0.49mm/px · z∈[-132,+96]mm · 5 of 26 slices shown]
[im 1/26]
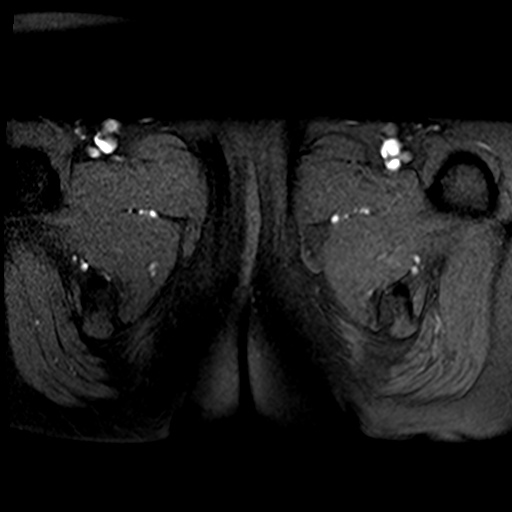
[im 7/26]
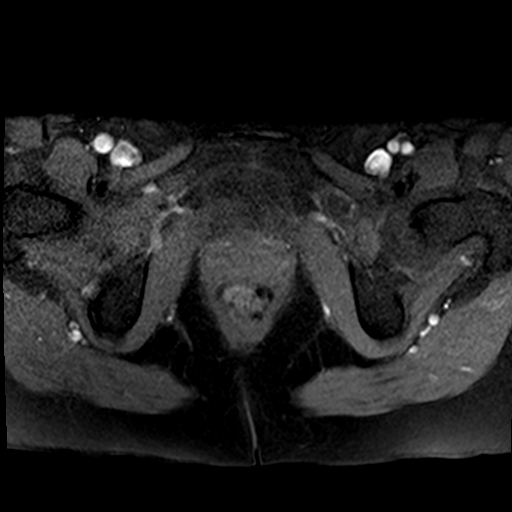
[im 13/26]
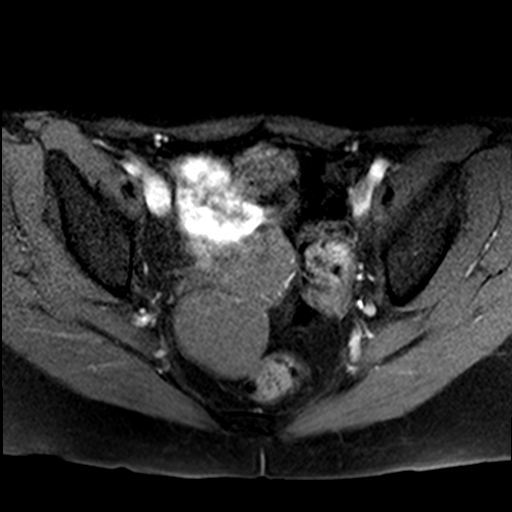
[im 19/26]
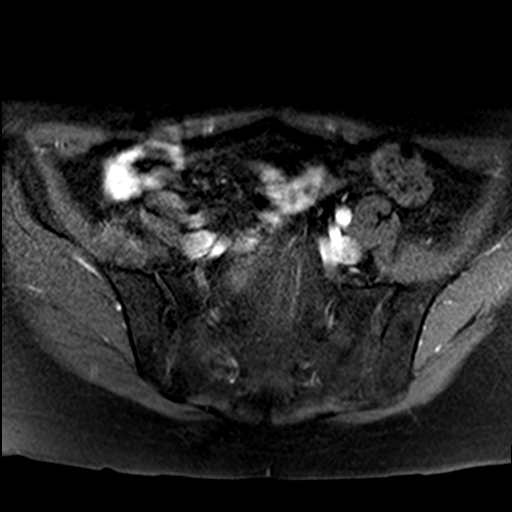
[im 26/26]
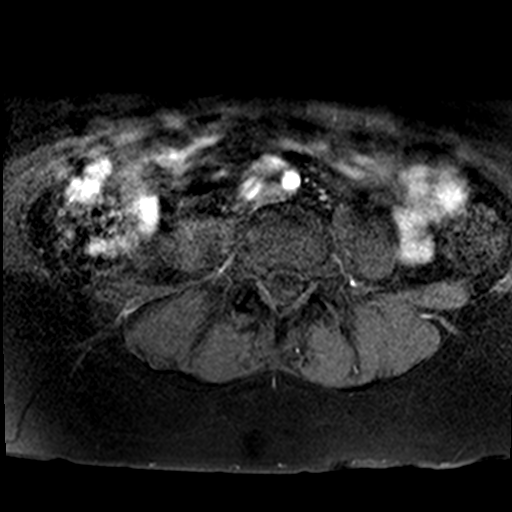

[Series 8: axial spgr post · axial · 7.0mm · 0.49mm/px · z∈[-132,+32]mm · 4 of 26 slices shown]
[im 1/26]
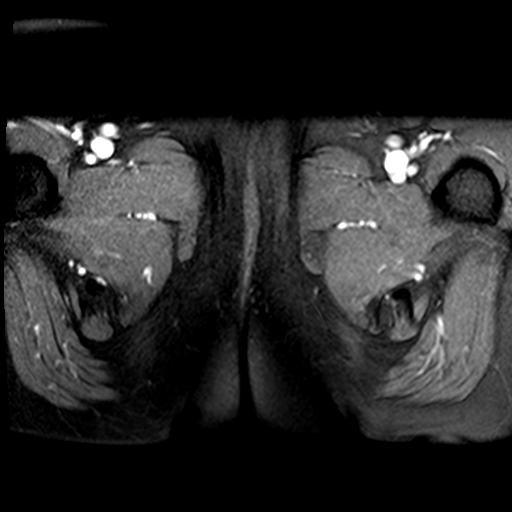
[im 7/26]
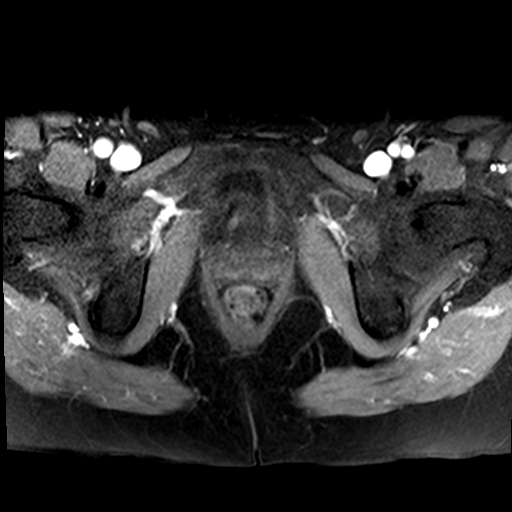
[im 13/26]
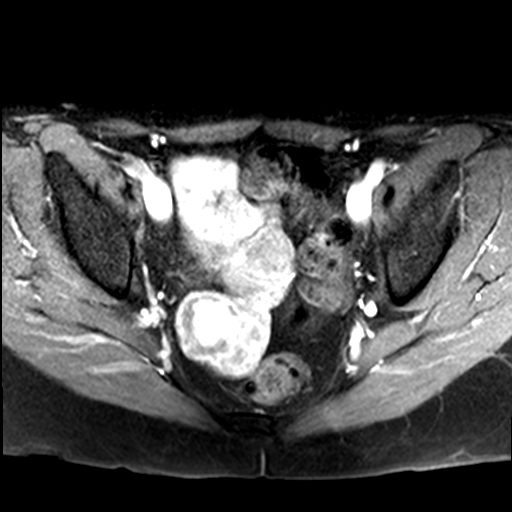
[im 19/26]
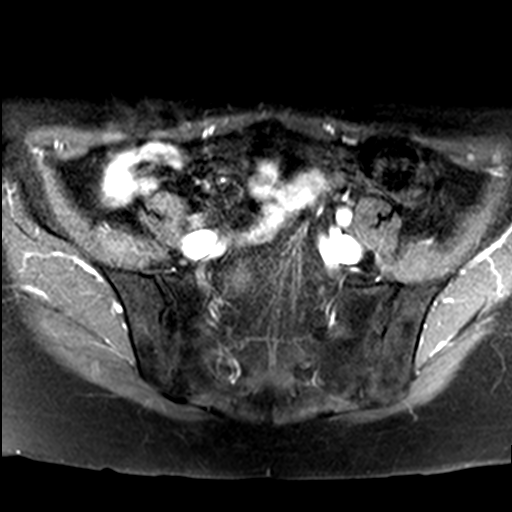

[31 of 48 positions shown; findings below may reference images not displayed]

FINDINGS: Urinary Tract:  No abnormality visualized.

Bowel:  Unremarkable visualized pelvic bowel loops.

Vascular/Lymphatic: No pathologically enlarged lymph nodes. No
significant vascular abnormality seen.

Reproductive: There is a large, pedunculated exophytic fibroid of
the posterior uterine fundus measuring 5.0 x 2.6 cm (series 18,
image 14). There is an additional intramural fibroid of the anterior
uterine fundus measuring 2.8 x 2.7 cm (series 8, image 14).
Additional, scattered subcentimeter fibroids throughout the uterus.
The uterus is not significantly enlarged, measuring 4.5 x 3.0 x
cm in overall dimension. The ovaries are unremarkable.

Other:  None.

Musculoskeletal: No suspicious bone lesions identified.
IMPRESSION: 1. Large, pedunculated exophytic fibroid of the posterior uterine
fundus measuring 5.0 x 2.6 cm.
2. Additional intramural fibroid of the anterior uterine fundus
measuring 2.8 x 2.7 cm.
3. Additional, scattered subcentimeter fibroids throughout the
uterus.

## 2023-03-31 ENCOUNTER — Other Ambulatory Visit (HOSPITAL_COMMUNITY): Payer: Self-pay

## 2023-03-31 ENCOUNTER — Telehealth: Admitting: Physician Assistant

## 2023-03-31 DIAGNOSIS — B029 Zoster without complications: Secondary | ICD-10-CM | POA: Diagnosis not present

## 2023-03-31 MED ORDER — VALACYCLOVIR HCL 1 G PO TABS
1000.0000 mg | ORAL_TABLET | Freq: Three times a day (TID) | ORAL | 0 refills | Status: AC
  Filled 2023-03-31: qty 21, 7d supply, fill #0

## 2023-03-31 NOTE — Progress Notes (Signed)

## 2023-04-05 ENCOUNTER — Telehealth: Payer: Self-pay | Admitting: Family Medicine

## 2023-04-05 NOTE — Telephone Encounter (Addendum)
 Please advise pt at [phone] that I didn't see her for this condition. She will need to make OV with me to complete paperwork. This can be a quick video visit if she would like.

## 2023-04-05 NOTE — Telephone Encounter (Signed)
 Copied from CRM [id number]. Topic: General - Other >> Apr 02, 2023 11:37 AM [name] wrote: Reason for CRM: Patient is calling in to let Dr. Wyline Mood know that she was diagnosed with shingles on 3/26. [employer] will be sending Dr. Wyline Mood a form to fill out. The patient can be contacted through MyChart and by phone at [phone] >> Apr 02, 2023 12:15 PM Alonza Bogus wrote: CRM sent this over. Pt will be reaching out to you with forms to fill out. She found out she had shingles on 03/11/2023.

## 2023-04-05 NOTE — Telephone Encounter (Signed)
 Patient stated that she was going to call Laurey Morale her case worker on this one and she would call back if she needs too. tvt

## 2023-04-26 DIAGNOSIS — R92323 Mammographic fibroglandular density, bilateral breasts: Secondary | ICD-10-CM | POA: Diagnosis not present

## 2023-04-26 DIAGNOSIS — Z1231 Encounter for screening mammogram for malignant neoplasm of breast: Secondary | ICD-10-CM | POA: Diagnosis not present

## 2023-04-30 DIAGNOSIS — B0223 Postherpetic polyneuropathy: Secondary | ICD-10-CM | POA: Diagnosis not present

## 2023-04-30 DIAGNOSIS — Z01411 Encounter for gynecological examination (general) (routine) with abnormal findings: Secondary | ICD-10-CM | POA: Diagnosis not present

## 2023-04-30 DIAGNOSIS — R87810 Cervical high risk human papillomavirus (HPV) DNA test positive: Secondary | ICD-10-CM | POA: Diagnosis not present

## 2023-04-30 DIAGNOSIS — Z124 Encounter for screening for malignant neoplasm of cervix: Secondary | ICD-10-CM | POA: Diagnosis not present

## 2023-04-30 DIAGNOSIS — L900 Lichen sclerosus et atrophicus: Secondary | ICD-10-CM | POA: Diagnosis not present

## 2023-04-30 DIAGNOSIS — Z01419 Encounter for gynecological examination (general) (routine) without abnormal findings: Secondary | ICD-10-CM | POA: Diagnosis not present

## 2023-04-30 DIAGNOSIS — Z1331 Encounter for screening for depression: Secondary | ICD-10-CM | POA: Diagnosis not present

## 2023-05-01 ENCOUNTER — Other Ambulatory Visit (HOSPITAL_COMMUNITY): Payer: Self-pay

## 2023-05-03 ENCOUNTER — Other Ambulatory Visit (HOSPITAL_COMMUNITY): Payer: Self-pay

## 2023-05-03 MED ORDER — ACYCLOVIR 5 % EX CREA
1.0000 | TOPICAL_CREAM | Freq: Every day | CUTANEOUS | 1 refills | Status: DC | PRN
  Filled 2023-05-03: qty 5, 7d supply, fill #0

## 2023-05-03 MED ORDER — ACYCLOVIR 400 MG PO TABS
400.0000 mg | ORAL_TABLET | Freq: Three times a day (TID) | ORAL | 3 refills | Status: AC | PRN
Start: 2023-05-03 — End: ?
  Filled 2023-05-03: qty 30, 10d supply, fill #0

## 2023-06-07 ENCOUNTER — Ambulatory Visit (INDEPENDENT_AMBULATORY_CARE_PROVIDER_SITE_OTHER)

## 2023-06-07 ENCOUNTER — Encounter: Payer: Self-pay | Admitting: Podiatry

## 2023-06-07 ENCOUNTER — Ambulatory Visit (INDEPENDENT_AMBULATORY_CARE_PROVIDER_SITE_OTHER): Admitting: Podiatry

## 2023-06-07 DIAGNOSIS — M7752 Other enthesopathy of left foot: Secondary | ICD-10-CM

## 2023-06-07 DIAGNOSIS — M7750 Other enthesopathy of unspecified foot: Secondary | ICD-10-CM

## 2023-06-07 NOTE — Progress Notes (Signed)
   Chief Complaint  Patient presents with   Plantar Fasciitis    "I may be due for a shot.  I want him to check the top of my foot." N - top of foot hurts L - dorsal pain lateral left D - 2 weeks O - suddenly, gotten worse while at work C - tender A - working all day, hit it a certain way T - Ibuprofen    Subjective: 58 y.o. female presenting for evaluation of acute flareup of pain and tenderness to the left foot over the last 2-3 weeks.  No history of injury.  No change in activity.  She says that over the last 2-3 weeks she did start to wear sandals for the summertime.  This is when the pain initiated   Past Medical History:  Diagnosis Date   DDD (degenerative disc disease), lumbar    Lichen sclerosus et atrophicus    Postmenopausal atrophic vaginitis    Uterine leiomyoma 06/06/2021   Past Surgical History:  Procedure Laterality Date   CHOLECYSTECTOMY  2005   DILATION AND CURETTAGE OF UTERUS  2007   Allergies  Allergen Reactions   Sulfa Antibiotics Tinitus   Penicillins Hives     Objective: Physical Exam General: The patient is alert and oriented x3 in no acute distress.  Dermatology: Skin is warm, dry and supple bilateral lower extremities. Negative for open lesions or macerations bilateral.   Vascular: Dorsalis Pedis and Posterior Tibial pulses palpable bilateral.  Capillary fill time is immediate to all digits.  Neurological: Grossly intact via light touch  Musculoskeletal: Diffuse tenderness throughout palpation to the left foot.  Tenderness to the anterior lateral aspect of the left ankle joint as well as around the third metatarsal and third TMT.  Radiographic exam LT foot 06/07/2023: No fracture identified.  Joint spaces mostly preserved.  There may be some arthritic degenerative changes noted to the third TMT.  Assessment: 1. Plantar fasciitis left foot  Plan of Care:  -Patient evaluated.  -No cortisone injection administered today.  Patient declined -  Declined anti-inflammatory.  She says that she still has a prescription for meloxicam  15 mg that she never took -Patient began to notice the pain when she began wearing sandals over the last 2-3 weeks for the summer.  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 Dot Gazella, DPM    [address] [place], [place] [address]                Office [phone]  Fax [phone]

## 2023-07-22 ENCOUNTER — Encounter: Payer: Self-pay | Admitting: Family Medicine

## 2023-07-23 ENCOUNTER — Ambulatory Visit (INDEPENDENT_AMBULATORY_CARE_PROVIDER_SITE_OTHER): Admitting: Family Medicine

## 2023-07-23 ENCOUNTER — Encounter: Payer: Self-pay | Admitting: Family Medicine

## 2023-07-23 VITALS — BP 128/82 | HR 98 | Temp 98.0°F | Resp 18 | Ht 65.0 in | Wt 187.0 lb

## 2023-07-23 DIAGNOSIS — R7302 Impaired glucose tolerance (oral): Secondary | ICD-10-CM

## 2023-07-23 DIAGNOSIS — Z136 Encounter for screening for cardiovascular disorders: Secondary | ICD-10-CM | POA: Diagnosis not present

## 2023-07-23 DIAGNOSIS — Z0000 Encounter for general adult medical examination without abnormal findings: Secondary | ICD-10-CM

## 2023-07-23 DIAGNOSIS — Z13220 Encounter for screening for lipoid disorders: Secondary | ICD-10-CM

## 2023-07-23 NOTE — Progress Notes (Signed)
 Complete physical exam  Patient: Alexis Shepherd   DOB: 12-Nov-1965   57 y.o. Female  MRN: [id number]  Subjective:    Chief Complaint  Patient presents with   Annual Exam    Alexis Shepherd is a 58 y.o. female who presents today for a complete physical exam. She reports consuming a general diet. Walking while at work She generally feels well. She reports sleeping well. She does not have additional problems to discuss today.    Most recent fall risk assessment:    02/17/2022    8:25 AM  Fall Risk   Falls in the past year? 0  Number falls in past yr: 0  Injury with Fall? 0  Risk for fall due to : No Fall Risks     Most recent depression screenings:    02/17/2022    8:24 AM  PHQ 2/9 Scores  PHQ - 2 Score 0  PHQ- 9 Score 2    Vision:Within last year  There are no active problems to display for this patient.  Past Medical History:  Diagnosis Date   Allergy    DDD (degenerative disc disease), lumbar    Lichen sclerosus et atrophicus    Postmenopausal atrophic vaginitis    Uterine leiomyoma 06/06/2021   Past Surgical History:  Procedure Laterality Date   CHOLECYSTECTOMY  2005   DILATION AND CURETTAGE OF UTERUS  2007   Social History   Tobacco Use   Smoking status: Never   Smokeless tobacco: Never  Substance Use Topics   Alcohol use: Yes    Alcohol/week: 3.0 standard drinks of alcohol    Types: 2 Glasses of wine, 1 Cans of beer per week    Comment: occasionally   Drug use: Never   Family Status  Relation Name Status   Mother Estela Deceased   Father Beverlee Deceased  No partnership data on file   Allergies  Allergen Reactions   Sulfa Antibiotics Tinitus   Penicillins Hives      Patient Care Team: Colette Torrence GRADE, MD as PCP - General (Family Medicine)   Outpatient Medications Prior to Visit  Medication Sig   acyclovir  (ZOVIRAX ) 400 MG tablet Take 1 tablet (400 mg total) by mouth every 8 (eight) hours as needed.   No facility-administered  medications prior to visit.    Review of Systems  All other systems reviewed and are negative.         Objective:     Ht 5' 5 (1.651 m)   LMP  (LMP Unknown)   BMI 30.05 kg/m  BP Readings from Last 3 Encounters:  06/18/22 98/62  02/17/22 116/68      Physical Exam Vitals and nursing note reviewed.  Constitutional:      Appearance: Normal appearance. She is normal weight.  HENT:     Head: Normocephalic and atraumatic.     Right Ear: Tympanic membrane, ear canal and external ear normal.     Left Ear: Tympanic membrane, ear canal and external ear normal.     Nose: Nose normal.     Mouth/Throat:     Mouth: Mucous membranes are moist.     Pharynx: Oropharynx is clear.  Eyes:     Conjunctiva/sclera: Conjunctivae normal.     Pupils: Pupils are equal, round, and reactive to light.  Cardiovascular:     Rate and Rhythm: Normal rate and regular rhythm.     Pulses: Normal pulses.     Heart sounds: Normal heart sounds.  Pulmonary:     Effort: Pulmonary effort is normal.     Breath sounds: Normal breath sounds.  Abdominal:     General: Abdomen is flat. Bowel sounds are normal.  Skin:    General: Skin is warm.     Capillary Refill: Capillary refill takes less than 2 seconds.  Neurological:     General: No focal deficit present.     Mental Status: She is alert and oriented to person, place, and time. Mental status is at baseline.  Psychiatric:        Mood and Affect: Mood normal.        Behavior: Behavior normal.        Thought Content: Thought content normal.        Judgment: Judgment normal.     No results found for any visits on 07/23/23. Last CBC Lab Results  Component Value Date   WBC 4.7 06/18/2022   HGB 13.1 06/18/2022   HCT 39.6 06/18/2022   MCV 92 06/18/2022   MCH 30.5 06/18/2022   RDW 12.5 06/18/2022   PLT 360 06/18/2022   Last metabolic panel Lab Results  Component Value Date   GLUCOSE 86 06/18/2022   NA 142 06/18/2022   K 4.5 06/18/2022   CL  103 06/18/2022   CO2 24 06/18/2022   BUN 13 06/18/2022   CREATININE 0.95 06/18/2022   EGFR 71 06/18/2022   CALCIUM 9.8 06/18/2022   PROT 7.1 06/18/2022   ALBUMIN 4.4 06/18/2022   LABGLOB 2.7 06/18/2022   AGRATIO 1.6 06/18/2022   BILITOT 0.4 06/18/2022   ALKPHOS 125 (H) 06/18/2022   AST 18 06/18/2022   ALT 15 06/18/2022   Last lipids Lab Results  Component Value Date   CHOL 237 (H) 06/18/2022   HDL 74 06/18/2022   LDLCALC 151 (H) 06/18/2022   TRIG 72 06/18/2022   CHOLHDL 3.2 06/18/2022   Last hemoglobin A1c Lab Results  Component Value Date   HGBA1C 5.6 06/18/2022        Assessment & Plan:    Routine Health Maintenance and Physical Exam  Immunization History  Administered Date(s) Administered   Influenza,inj,Quad PF,6+ Mos 10/06/2021   PFIZER(Purple Top)SARS-COV-2 Vaccination 06/16/2019, 07/07/2019, 01/27/2020   Tdap 05/06/2018    Health Maintenance  Topic Date Due   Hepatitis B Vaccines (1 of 3 - 19+ 3-dose series) Never done   Zoster Vaccines- Shingrix (1 of 2) Never done   COVID-19 Vaccine (4 - 2024-25 season) 09/06/2022   INFLUENZA VACCINE  08/06/2023   Cervical Cancer Screening (HPV/Pap Cotest)  02/22/2024   MAMMOGRAM  04/05/2024   DTaP/Tdap/Td (2 - Td or Tdap) 05/05/2028   Colonoscopy  05/14/2029   Hepatitis C Screening  Completed   HIV Screening  Completed   HPV VACCINES  Aged Out   Meningococcal B Vaccine  Aged Out    Discussed health benefits of physical activity, and encouraged her to engage in regular exercise appropriate for her age and condition.  Problem List Items Addressed This Visit   None  No follow-ups on file. Annual physical exam  Encounter for lipid screening for cardiovascular disease -     Lipid panel  Impaired glucose tolerance -     CBC with Differential/Platelet -     Comprehensive metabolic panel with GFR -     Hemoglobin A1c   Screening labs for CPE See in 1 year sooner prn    Torrence CINDERELLA Barrier, MD

## 2023-07-24 LAB — COMPREHENSIVE METABOLIC PANEL WITH GFR
ALT: 21 IU/L (ref 0–32)
AST: 20 IU/L (ref 0–40)
Albumin: 4.5 g/dL (ref 3.8–4.9)
Alkaline Phosphatase: 105 IU/L (ref 44–121)
BUN/Creatinine Ratio: 17 (ref 9–23)
BUN: 15 mg/dL (ref 6–24)
Bilirubin Total: 0.5 mg/dL (ref 0.0–1.2)
CO2: 23 mmol/L (ref 20–29)
Calcium: 10.3 mg/dL — ABNORMAL HIGH (ref 8.7–10.2)
Chloride: 105 mmol/L (ref 96–106)
Creatinine, Ser: 0.9 mg/dL (ref 0.57–1.00)
Globulin, Total: 2.6 g/dL (ref 1.5–4.5)
Glucose: 88 mg/dL (ref 70–99)
Potassium: 4.8 mmol/L (ref 3.5–5.2)
Sodium: 144 mmol/L (ref 134–144)
Total Protein: 7.1 g/dL (ref 6.0–8.5)
eGFR: 75 mL/min/1.73 (ref >59)

## 2023-07-24 LAB — CBC WITH DIFFERENTIAL/PLATELET
Basophils Absolute: 0.1 x10E3/uL (ref 0.0–0.2)
Basos: 1 %
EOS (ABSOLUTE): 0.3 x10E3/uL (ref 0.0–0.4)
Eos: 5 %
Hematocrit: 42.2 % (ref 34.0–46.6)
Hemoglobin: 13.1 g/dL (ref 11.1–15.9)
Immature Grans (Abs): 0 x10E3/uL (ref 0.0–0.1)
Immature Granulocytes: 0 %
Lymphocytes Absolute: 1.8 x10E3/uL (ref 0.7–3.1)
Lymphs: 35 %
MCH: 29.9 pg (ref 26.6–33.0)
MCHC: 31 g/dL — ABNORMAL LOW (ref 31.5–35.7)
MCV: 96 fL (ref 79–97)
Monocytes Absolute: 0.5 x10E3/uL (ref 0.1–0.9)
Monocytes: 10 %
Neutrophils Absolute: 2.6 x10E3/uL (ref 1.4–7.0)
Neutrophils: 49 %
Platelets: 384 x10E3/uL (ref 150–450)
RBC: 4.38 x10E6/uL (ref 3.77–5.28)
RDW: 12.4 % (ref 11.7–15.4)
WBC: 5.2 x10E3/uL (ref 3.4–10.8)

## 2023-07-24 LAB — LIPID PANEL
Chol/HDL Ratio: 4.3 ratio (ref 0.0–4.4)
Cholesterol, Total: 274 mg/dL — ABNORMAL HIGH (ref 100–199)
HDL: 64 mg/dL (ref >39)
LDL Chol Calc (NIH): 194 mg/dL — ABNORMAL HIGH (ref 0–99)
Triglycerides: 93 mg/dL (ref 0–149)
VLDL Cholesterol Cal: 16 mg/dL (ref 5–40)

## 2023-07-24 LAB — HEMOGLOBIN A1C
Est. average glucose Bld gHb Est-mCnc: 111 mg/dL
Hgb A1c MFr Bld: 5.5 % (ref 4.8–5.6)

## 2023-07-26 ENCOUNTER — Ambulatory Visit: Payer: Self-pay | Admitting: Family Medicine

## 2023-08-16 ENCOUNTER — Encounter: Payer: Self-pay | Admitting: Podiatry

## 2023-08-16 ENCOUNTER — Ambulatory Visit (INDEPENDENT_AMBULATORY_CARE_PROVIDER_SITE_OTHER): Admitting: Podiatry

## 2023-08-16 DIAGNOSIS — M722 Plantar fascial fibromatosis: Secondary | ICD-10-CM

## 2023-08-16 NOTE — Progress Notes (Signed)
   Chief Complaint  Patient presents with   Plantar Fasciitis    I'm still having the same issues as before.  The Plantar Fasciitis and the bone spurs, I need an injection.   Tendonitis    I think the upper pain that I'm having is Peroneal Tendonitis.  I still get the snapping and popping.    Subjective: 57 y.o. female presenting for follow-up evaluation of plantar fasciitis to the left foot.  Past Medical History:  Diagnosis Date   Allergy    DDD (degenerative disc disease), lumbar    Lichen sclerosus et atrophicus    Postmenopausal atrophic vaginitis    Uterine leiomyoma 06/06/2021   Past Surgical History:  Procedure Laterality Date   CHOLECYSTECTOMY  2005   DILATION AND CURETTAGE OF UTERUS  2007   Allergies  Allergen Reactions   Sulfa Antibiotics Tinitus   Penicillins Hives     Objective: Physical Exam General: The patient is alert and oriented x3 in no acute distress.  Dermatology: Skin is warm, dry and supple bilateral lower extremities. Negative for open lesions or macerations bilateral.   Vascular: Dorsalis Pedis and Posterior Tibial pulses palpable bilateral.  Capillary fill time is immediate to all digits.  Neurological: Grossly intact via light touch  Musculoskeletal: Tenderness with palpation along the medial longitudinal arch of the left foot consistent with plantar fasciitis.    Radiographic exam LT foot 06/07/2023: No fracture identified.  Joint spaces mostly preserved.  There may be some arthritic degenerative changes noted to the third TMT.  Assessment: 1. Plantar fasciitis left foot  Plan of Care:  -Patient evaluated.  - Injection of 0.5 cc Celestone  Soluspan injected into the plantar fascia left -Continue meloxicam  15 mg daily PRN - Continue good supportive tennis shoes and sneakers.  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 Thresa EMERSON Sar, DPM    [address] [place], [place] [address]                Office [phone]  Fax [phone]

## 2023-08-18 ENCOUNTER — Other Ambulatory Visit: Payer: Self-pay

## 2023-08-18 ENCOUNTER — Other Ambulatory Visit: Payer: Self-pay | Admitting: Podiatry

## 2023-08-19 ENCOUNTER — Other Ambulatory Visit (HOSPITAL_COMMUNITY): Payer: Self-pay

## 2023-08-20 ENCOUNTER — Other Ambulatory Visit: Payer: Self-pay | Admitting: Podiatry

## 2023-08-20 ENCOUNTER — Telehealth: Payer: Self-pay | Admitting: Lab

## 2023-08-20 ENCOUNTER — Encounter (HOSPITAL_COMMUNITY): Payer: Self-pay

## 2023-08-20 ENCOUNTER — Other Ambulatory Visit (HOSPITAL_COMMUNITY): Payer: Self-pay

## 2023-08-20 NOTE — Telephone Encounter (Signed)
 Patient requesting meloxicam  refill

## 2023-08-20 NOTE — Telephone Encounter (Signed)
 I didn't order refill due to it being removed from previous provider will let our provider decide on order.

## 2023-08-26 ENCOUNTER — Other Ambulatory Visit (HOSPITAL_COMMUNITY): Payer: Self-pay

## 2023-08-27 ENCOUNTER — Telehealth: Payer: Self-pay | Admitting: Family Medicine

## 2023-08-27 NOTE — Telephone Encounter (Signed)
 The medication is still on her medication list, so I'm not sure. It may have been accidentally dropped off during med reconciliation when she came for her appointment in July?

## 2023-08-27 NOTE — Telephone Encounter (Signed)
 Copied from CRM [id number]. Topic: Clinical - Prescription Issue >> Aug 27, 2023  8:56 AM Alexis Shepherd wrote: Reason for CRM: Please call the patient regarding meloxicam  (MOBIC ) 15 MG tablet [[id number]] prescription//The pharmacy advised her that DR Ruck discontinued the medication but it was prescribed by podiatry//Please contact the patient//

## 2023-08-30 ENCOUNTER — Other Ambulatory Visit (HOSPITAL_COMMUNITY): Payer: Self-pay

## 2023-08-30 ENCOUNTER — Telehealth: Payer: Self-pay

## 2023-08-30 NOTE — Telephone Encounter (Signed)
 Spoke with patient regarding medication Mobic  patient states that [place] from [facility] told patient that he spoke with someone in out office and was told that somebody could have accidentally been in Dr.Ruckers computer and discontinued the medication. I informed patient that did not happen. Advised patient I would call pharmacy and see what  the issue was. Spoke with pharmacy and was able to get clarification and pharmacy tech was able to re- activate medication that was originally prescribed by podiatry   Copied from CRM [id number]. Topic: Clinical - Medication Question >> Aug 30, 2023 10:29 AM Franky GRADE wrote: Reason for CRM: Manus from [facility] is calling because it looks like we removed meloxicam  (MOBIC ) 15 MG tablet [[id number]]qmnf her active medication. They tried to submit a refill request but were unable to. They would like to know if Dr.Rucker discontinued it for a reason or if it was an error.

## 2023-08-31 DIAGNOSIS — M722 Plantar fascial fibromatosis: Secondary | ICD-10-CM | POA: Diagnosis not present

## 2023-08-31 MED ORDER — BETAMETHASONE SOD PHOS & ACET 6 (3-3) MG/ML IJ SUSP
3.0000 mg | Freq: Once | INTRAMUSCULAR | Status: AC
  Administered 2023-08-31: 3 mg via INTRA_ARTICULAR

## 2023-09-01 ENCOUNTER — Other Ambulatory Visit (HOSPITAL_COMMUNITY): Payer: Self-pay

## 2023-09-01 ENCOUNTER — Other Ambulatory Visit: Payer: Self-pay | Admitting: Podiatry

## 2023-09-01 MED ORDER — MELOXICAM 15 MG PO TABS
15.0000 mg | ORAL_TABLET | Freq: Every day | ORAL | 1 refills | Status: AC
  Filled 2023-09-01: qty 60, 60d supply, fill #0

## 2023-09-01 NOTE — Progress Notes (Signed)
 Refill meloxicam  15 mg daily PRN  Thresa EMERSON Sar, DPM [facility]  Dr. Thresa EMERSON Sar, DPM    [address] [place], [place] [address]                Office [phone]  Fax [phone]

## 2023-09-02 ENCOUNTER — Other Ambulatory Visit (HOSPITAL_COMMUNITY): Payer: Self-pay

## 2023-09-13 ENCOUNTER — Ambulatory Visit (INDEPENDENT_AMBULATORY_CARE_PROVIDER_SITE_OTHER): Admitting: Podiatry

## 2023-09-13 VITALS — Ht 65.0 in | Wt 187.0 lb

## 2023-09-13 DIAGNOSIS — M722 Plantar fascial fibromatosis: Secondary | ICD-10-CM | POA: Diagnosis not present

## 2023-09-13 MED ORDER — BETAMETHASONE SOD PHOS & ACET 6 (3-3) MG/ML IJ SUSP
3.0000 mg | Freq: Once | INTRAMUSCULAR | Status: AC
  Administered 2023-09-13: 3 mg via INTRA_ARTICULAR

## 2023-09-13 NOTE — Progress Notes (Signed)
   Chief Complaint  Patient presents with   Plantar Fasciitis    Rm 6 Return in about one month as a follow-up for plantar fascitis of the left foot. Patient states continued pain in the left foot after prolonged standing and walking. Patient would like to discuss receiving an injection for pain today.    Subjective: 58 y.o. female presenting for follow-up evaluation of plantar fasciitis to the left foot.  Past Medical History:  Diagnosis Date   Allergy    DDD (degenerative disc disease), lumbar    Lichen sclerosus et atrophicus    Postmenopausal atrophic vaginitis    Uterine leiomyoma 06/06/2021   Past Surgical History:  Procedure Laterality Date   CHOLECYSTECTOMY  2005   DILATION AND CURETTAGE OF UTERUS  2007   Allergies  Allergen Reactions   Sulfa Antibiotics Tinitus   Penicillins Hives     Objective: Physical Exam General: The patient is alert and oriented x3 in no acute distress.  Dermatology: Skin is warm, dry and supple bilateral lower extremities. Negative for open lesions or macerations bilateral.   Vascular: Dorsalis Pedis and Posterior Tibial pulses palpable bilateral.  Capillary fill time is immediate to all digits.  Neurological: Grossly intact via light touch  Musculoskeletal: Tenderness with palpation along the medial longitudinal arch of the left foot consistent with plantar fasciitis.    Radiographic exam LT foot 06/07/2023: No fracture identified.  Joint spaces mostly preserved.  There may be some arthritic degenerative changes noted to the third TMT.  Assessment: 1. Plantar fasciitis left foot  Plan of Care:  -Patient evaluated.  - Injection of 0.5 cc Celestone  Soluspan injected into the plantar fascia left -Continue meloxicam  15 mg daily PRN - Continue good supportive tennis shoes and sneakers.  Refrain from going barefoot-appointment with orthotics department for custom orthotics - Today we discussed additional modalities including PRP, stem  cell, EPAT therapy, physical therapy, and ultimately surgery.  For now we will hold off on any of these additional modalities and see if orthotics help alleviate a lot of her symptoms and pain. -Return to clinic approximately 2 months after she has had orthotics dispensed  *Radiology technician at [facility]   Thresa EMERSON Sar, DPM [facility]  Dr. Thresa EMERSON Sar, DPM    [address] [place], [place] [address]                Office [phone]  Fax [phone]

## 2023-10-06 ENCOUNTER — Ambulatory Visit (INDEPENDENT_AMBULATORY_CARE_PROVIDER_SITE_OTHER)

## 2023-10-06 DIAGNOSIS — M722 Plantar fascial fibromatosis: Secondary | ICD-10-CM

## 2023-10-06 DIAGNOSIS — M2142 Flat foot [pes planus] (acquired), left foot: Secondary | ICD-10-CM

## 2023-10-06 DIAGNOSIS — M2141 Flat foot [pes planus] (acquired), right foot: Secondary | ICD-10-CM | POA: Diagnosis not present

## 2023-10-06 DIAGNOSIS — M7750 Other enthesopathy of unspecified foot: Secondary | ICD-10-CM | POA: Diagnosis not present

## 2023-10-06 NOTE — Progress Notes (Signed)
 Orthotics   Patient was present and evaluated for Custom molded foot orthotics. Patient will benefit from CFO's to provide total contact to BIL MLA's helping to balance and distribute body weight more evenly across BIL feet helping to reduce plantar pressure and pain. Orthotic will also encourage FF / RF alignment  Patient was scanned today and will return for fitting upon receipt

## 2023-10-15 ENCOUNTER — Encounter (INDEPENDENT_AMBULATORY_CARE_PROVIDER_SITE_OTHER)

## 2023-10-15 ENCOUNTER — Encounter: Payer: Self-pay | Admitting: *Deleted

## 2023-10-15 NOTE — Progress Notes (Signed)
 Patient came in for EPAT treatment. Per last OV note the plan was to hold off on treatments until receiving and trying out orthotics. Received verbal order okay to proceed with EPAT treatments. Patient informed RN that she has a vacation within the next week and will be very active and she was not made aware of need for cam boot 3 days post treatment, need to d/c NSAIDS and anti-inflammatories as well as decreasing physical activity. RN recommended holding treatments and proceeding with original provider plan of receiving and trial of orthotics and following up in November with provider. Patient is in agreement with plan and will reach out if any thing changes between now and then. Follow up scheduled with Dr. Janit.

## 2023-10-15 NOTE — Progress Notes (Unsigned)
 Okay

## 2023-10-27 ENCOUNTER — Other Ambulatory Visit

## 2023-11-29 ENCOUNTER — Encounter: Payer: Self-pay | Admitting: Podiatry

## 2023-11-29 ENCOUNTER — Ambulatory Visit (INDEPENDENT_AMBULATORY_CARE_PROVIDER_SITE_OTHER): Admitting: Podiatry

## 2023-11-29 VITALS — Ht 65.0 in | Wt 187.0 lb

## 2023-11-29 DIAGNOSIS — M722 Plantar fascial fibromatosis: Secondary | ICD-10-CM | POA: Diagnosis not present

## 2023-11-29 NOTE — Progress Notes (Unsigned)
   Chief Complaint  Patient presents with   Plantar Fasciitis    Pt is here to f/u on left foot due to plantar fasciitis, wants to discuss pain at the top of both feet,states there has been an issue with scheduling, has not had epact as of now, frustrated due to having appointments and them being cancelled the day of.     Subjective: 58 y.o. female presenting for follow-up evaluation of plantar fasciitis to the left foot.  Past Medical History:  Diagnosis Date   Allergy    DDD (degenerative disc disease), lumbar    Lichen sclerosus et atrophicus    Postmenopausal atrophic vaginitis    Uterine leiomyoma 06/06/2021   Past Surgical History:  Procedure Laterality Date   CHOLECYSTECTOMY  2005   DILATION AND CURETTAGE OF UTERUS  2007   Allergies  Allergen Reactions   Sulfa Antibiotics Tinitus   Penicillins Hives     Objective: Physical Exam General: The patient is alert and oriented x3 in no acute distress.  Dermatology: Skin is warm, dry and supple bilateral lower extremities. Negative for open lesions or macerations bilateral.   Vascular: Dorsalis Pedis and Posterior Tibial pulses palpable bilateral.  Capillary fill time is immediate to all digits.  Neurological: Grossly intact via light touch  Musculoskeletal: Tenderness with palpation along the medial longitudinal arch of the left foot consistent with plantar fasciitis.    Radiographic exam LT foot 06/07/2023: No fracture identified.  Joint spaces mostly preserved.  There may be some arthritic degenerative changes noted to the third TMT.  Assessment: 1. Plantar fasciitis left foot  Plan of Care:  -Patient evaluated.  - Injection of 0.5 cc Celestone  Soluspan injected into the plantar fascia left -Continue meloxicam  15 mg daily PRN - Continue good supportive tennis shoes and sneakers.  Refrain from going barefoot-appointment with orthotics department for custom orthotics - Today we discussed additional modalities  including PRP, stem cell, EPAT therapy, physical therapy, and ultimately surgery.  For now we will hold off on any of these additional modalities and see if orthotics help alleviate a lot of her symptoms and pain. -Return to clinic approximately 2 months after she has had orthotics dispensed  *Radiology technician at [facility]   Thresa EMERSON Sar, DPM [facility]  Dr. Thresa EMERSON Sar, DPM    [address] [place], [place] [address]                Office [phone]  Fax [phone]

## 2023-12-09 ENCOUNTER — Other Ambulatory Visit

## 2024-07-24 ENCOUNTER — Encounter: Admitting: Family Medicine
